# Patient Record
Sex: Male | Born: 1995 | Race: White | Hispanic: No | Marital: Single | Smoking: Never smoker
Health system: Southern US, Community
[De-identification: ages and names within clinical notes are randomized; demographics above are authoritative.]

---

## 2020-02-10 ENCOUNTER — Other Ambulatory Visit: Payer: Self-pay

## 2020-02-10 ENCOUNTER — Inpatient Hospital Stay (HOSPITAL_BASED_OUTPATIENT_CLINIC_OR_DEPARTMENT_OTHER)
Admission: EM | Admit: 2020-02-10 | Discharge: 2020-02-14 | DRG: 177 | Disposition: A | Discharge status: Home / Self Care | Payer: BC Managed Care – PPO | Attending: Internal Medicine | Admitting: Internal Medicine

## 2020-02-10 ENCOUNTER — Emergency Department (HOSPITAL_BASED_OUTPATIENT_CLINIC_OR_DEPARTMENT_OTHER): Payer: BC Managed Care – PPO

## 2020-02-10 ENCOUNTER — Encounter (HOSPITAL_BASED_OUTPATIENT_CLINIC_OR_DEPARTMENT_OTHER): Payer: Self-pay

## 2020-02-10 DIAGNOSIS — J9600 Acute respiratory failure, unspecified whether with hypoxia or hypercapnia: Secondary | ICD-10-CM | POA: Diagnosis present

## 2020-02-10 DIAGNOSIS — J1282 Pneumonia due to coronavirus disease 2019: Secondary | ICD-10-CM | POA: Diagnosis present

## 2020-02-10 DIAGNOSIS — R197 Diarrhea, unspecified: Secondary | ICD-10-CM | POA: Diagnosis present

## 2020-02-10 DIAGNOSIS — J9601 Acute respiratory failure with hypoxia: Secondary | ICD-10-CM | POA: Diagnosis present

## 2020-02-10 DIAGNOSIS — U071 COVID-19: Secondary | ICD-10-CM | POA: Diagnosis not present

## 2020-02-10 DIAGNOSIS — R112 Nausea with vomiting, unspecified: Secondary | ICD-10-CM | POA: Diagnosis present

## 2020-02-10 DIAGNOSIS — E860 Dehydration: Secondary | ICD-10-CM | POA: Diagnosis present

## 2020-02-10 LAB — LACTIC ACID, PLASMA: Lactic Acid, Venous: 1.2 mmol/L (ref 0.5–1.9)

## 2020-02-10 LAB — COMPREHENSIVE METABOLIC PANEL
ALT: 44 U/L (ref 0–44)
AST: 55 U/L — ABNORMAL HIGH (ref 15–41)
Albumin: 4.1 g/dL (ref 3.5–5.0)
Alkaline Phosphatase: 52 U/L (ref 38–126)
Anion gap: 12 (ref 5–15)
BUN: 16 mg/dL (ref 6–20)
CO2: 25 mmol/L (ref 22–32)
Calcium: 9 mg/dL (ref 8.9–10.3)
Chloride: 101 mmol/L (ref 98–111)
Creatinine, Ser: 1.51 mg/dL — ABNORMAL HIGH (ref 0.61–1.24)
GFR calc Af Amer: >60 mL/min (ref >60)
GFR calc non Af Amer: >60 mL/min (ref >60)
Glucose, Bld: 123 mg/dL — ABNORMAL HIGH (ref 70–99)
Potassium: 4 mmol/L (ref 3.5–5.1)
Sodium: 138 mmol/L (ref 135–145)
Total Bilirubin: 0.8 mg/dL (ref 0.3–1.2)
Total Protein: 7.6 g/dL (ref 6.5–8.1)

## 2020-02-10 LAB — LACTATE DEHYDROGENASE: LDH: 241 U/L — ABNORMAL HIGH (ref 98–192)

## 2020-02-10 LAB — CBC WITH DIFFERENTIAL/PLATELET
Abs Immature Granulocytes: 0.02 10*3/uL (ref 0.00–0.07)
Basophils Absolute: 0 10*3/uL (ref 0.0–0.1)
Basophils Relative: 0 %
Eosinophils Absolute: 0 10*3/uL (ref 0.0–0.5)
Eosinophils Relative: 0 %
HCT: 42.5 % (ref 39.0–52.0)
Hemoglobin: 13 g/dL (ref 13.0–17.0)
Immature Granulocytes: 0 %
Lymphocytes Relative: 15 %
Lymphs Abs: 0.7 10*3/uL (ref 0.7–4.0)
MCH: 19.4 pg — ABNORMAL LOW (ref 26.0–34.0)
MCHC: 30.6 g/dL (ref 30.0–36.0)
MCV: 63.4 fL — ABNORMAL LOW (ref 80.0–100.0)
Monocytes Absolute: 0.3 10*3/uL (ref 0.1–1.0)
Monocytes Relative: 6 %
Neutro Abs: 3.6 10*3/uL (ref 1.7–7.7)
Neutrophils Relative %: 79 %
Platelets: 186 10*3/uL (ref 150–400)
RBC: 6.7 MIL/uL — ABNORMAL HIGH (ref 4.22–5.81)
RDW: 16.4 % — ABNORMAL HIGH (ref 11.5–15.5)
WBC: 4.6 10*3/uL (ref 4.0–10.5)
nRBC: 0 % (ref 0.0–0.2)

## 2020-02-10 LAB — FIBRINOGEN: Fibrinogen: 479 mg/dL — ABNORMAL HIGH (ref 210–475)

## 2020-02-10 LAB — FERRITIN: Ferritin: 475 ng/mL — ABNORMAL HIGH (ref 24–336)

## 2020-02-10 LAB — SARS CORONAVIRUS 2 BY RT PCR (HOSPITAL ORDER, PERFORMED IN ~~LOC~~ HOSPITAL LAB): SARS Coronavirus 2: POSITIVE — AB

## 2020-02-10 LAB — C-REACTIVE PROTEIN: CRP: 3 mg/dL — ABNORMAL HIGH (ref <1.0)

## 2020-02-10 LAB — PROCALCITONIN: Procalcitonin: <0.1 ng/mL

## 2020-02-10 LAB — TRIGLYCERIDES: Triglycerides: 113 mg/dL (ref <150)

## 2020-02-10 LAB — TROPONIN I (HIGH SENSITIVITY): Troponin I (High Sensitivity): 9 ng/L (ref <18)

## 2020-02-10 LAB — D-DIMER, QUANTITATIVE: D-Dimer, Quant: 0.51 ug/mL-FEU — ABNORMAL HIGH (ref 0.00–0.50)

## 2020-02-10 MED ORDER — ACETAMINOPHEN 500 MG PO TABS
ORAL_TABLET | ORAL | Status: AC
  Filled 2020-02-10: qty 2

## 2020-02-10 MED ORDER — SODIUM CHLORIDE 0.9 % IV SOLN
100.0000 mg | Freq: Once | INTRAVENOUS | Status: AC
  Administered 2020-02-10: 100 mg via INTRAVENOUS
  Filled 2020-02-10: qty 20

## 2020-02-10 MED ORDER — SODIUM CHLORIDE 0.9 % IV SOLN
100.0000 mg | Freq: Every day | INTRAVENOUS | Status: AC
  Administered 2020-02-11 – 2020-02-14 (×4): 100 mg via INTRAVENOUS
  Filled 2020-02-10 (×4): qty 20

## 2020-02-10 MED ORDER — ONDANSETRON HCL 4 MG/2ML IJ SOLN
4.0000 mg | Freq: Once | INTRAMUSCULAR | Status: AC
  Administered 2020-02-10: 4 mg via INTRAVENOUS
  Filled 2020-02-10: qty 2

## 2020-02-10 MED ORDER — DEXAMETHASONE SODIUM PHOSPHATE 10 MG/ML IJ SOLN
6.0000 mg | Freq: Once | INTRAMUSCULAR | Status: AC
  Administered 2020-02-10: 6 mg via INTRAVENOUS
  Filled 2020-02-10: qty 1

## 2020-02-10 MED ORDER — LACTATED RINGERS IV BOLUS
500.0000 mL | Freq: Once | INTRAVENOUS | Status: AC
  Administered 2020-02-10: 500 mL via INTRAVENOUS

## 2020-02-10 MED ORDER — ACETAMINOPHEN 500 MG PO TABS
1000.0000 mg | ORAL_TABLET | Freq: Once | ORAL | Status: AC
  Administered 2020-02-10: 1000 mg via ORAL

## 2020-02-10 NOTE — ED Provider Notes (Signed)
[facility] EMERGENCY DEPARTMENT Provider Note   CSN: [id number] Arrival date & time: 02/10/20  1500     History Chief Complaint  Patient presents with  . Covid Positive    Jeremy Fitzpatrick is a 24 y.o. Fitzpatrick.  HPI      Jeremy Fitzpatrick presents with concern for severe fatigue, body aches, nausea, vomiting, fever after testing positive for COVID 19.  Arrives to the ED hypoxic to 87% on room air.  Reports symptoms started one week ago, fevers at home, diaphoresis today.  Has had fevers and severe body aches for the last week.  Productive cough and sore throat.  Was telling himself he did not feel short of breath but admits that he thinks he has been for last few days. Vomiting approx twice per day, diarrhea 4-5 times daily.   Has not had covid vaccine. Gets palpitations with steroids.  No known medical history-exercise induced asthma, not active    History reviewed. No pertinent past medical history.  There are no problems to display for this patient.   History reviewed. No pertinent surgical history.     No family history on file.  Social History   Tobacco Use  . Smoking status: Never Smoker  . Smokeless tobacco: Never Used  Substance Use Topics  . Alcohol use: Not Currently  . Drug use: Never    Home Medications Prior to Admission medications   Not on File    Allergies    Patient has no known allergies.  Review of Systems   Review of Systems  Constitutional: Positive for activity change, appetite change, chills, fatigue and fever.  HENT: Positive for congestion, rhinorrhea and sore throat.   Eyes: Negative for visual disturbance.  Respiratory: Positive for cough and shortness of breath.   Cardiovascular: Positive for chest pain.  Gastrointestinal: Positive for diarrhea, nausea and vomiting. Negative for abdominal pain.  Genitourinary: Negative for difficulty urinating and dysuria.  Musculoskeletal: Positive for arthralgias and myalgias. Negative for  back pain and neck stiffness.  Skin: Negative for rash and wound.  Neurological: Negative for syncope.    Physical Exam Updated Vital Signs BP (!) 92/57 (BP Location: Right Arm)   Pulse (!) 102   Temp 100.3 F (37.9 C) (Oral)   Resp 20   Ht 5\' 7"  (1.702 m)   Wt 99.8 kg   SpO2 (!) 87%   BMI 34.46 kg/m   Physical Exam Vitals and nursing note reviewed.  Constitutional:      General: He is not in acute distress.    Appearance: He is well-developed. He is diaphoretic.  HENT:     Head: Normocephalic and atraumatic.  Eyes:     Conjunctiva/sclera: Conjunctivae normal.  Cardiovascular:     Rate and Rhythm: Normal rate and regular rhythm.  Pulmonary:     Effort: Pulmonary effort is normal. No respiratory distress.  Abdominal:     General: There is no distension.     Palpations: Abdomen is soft.     Tenderness: There is no abdominal tenderness. There is no guarding.  Musculoskeletal:     Cervical back: Normal range of motion.  Skin:    General: Skin is warm.  Neurological:     Mental Status: He is alert and oriented to person, place, and time.     ED Results / Procedures / Treatments   Labs (all labs ordered are listed, but only abnormal results are displayed) Labs Reviewed  CULTURE, BLOOD (ROUTINE X 2)  CULTURE, BLOOD (  ROUTINE X 2)  SARS CORONAVIRUS 2 BY RT PCR (HOSPITAL ORDER, PERFORMED IN [facility])  CBC WITH DIFFERENTIAL/PLATELET  COMPREHENSIVE METABOLIC PANEL  LACTIC ACID, PLASMA  LACTIC ACID, PLASMA  D-DIMER, QUANTITATIVE (NOT AT [facility])  PROCALCITONIN  LACTATE DEHYDROGENASE  FERRITIN  TRIGLYCERIDES  FIBRINOGEN  C-REACTIVE PROTEIN  TROPONIN I (HIGH SENSITIVITY)    EKG EKG Interpretation  Date/Time:  Friday February 10 2020 15:32:00 EDT Ventricular Rate:  97 PR Interval:    QRS Duration: 82 QT Interval:  330 QTC Calculation: 420 R Axis:   83 Text Interpretation: Sinus rhythm Baseline wander in lead(s) II III aVL aVF V5 V6 No previous  ECGs available Confirmed by Alvira Monday ([id number]) on 02/10/2020 3:43:49 PM   Radiology No results found.  Procedures Procedures (including critical care time)  Medications Ordered in ED Medications  acetaminophen (TYLENOL) 500 MG tablet (has no administration in time range)  acetaminophen (TYLENOL) tablet 1,000 mg (1,000 mg Oral Given 02/10/20 1552)  dexamethasone (DECADRON) injection 6 mg (6 mg Intravenous Given 02/10/20 1608)  lactated ringers bolus 500 mL (500 mLs Intravenous New Bag/Given 02/10/20 1607)  ondansetron (ZOFRAN) injection 4 mg (4 mg Intravenous Given 02/10/20 1608)    ED Course  I have reviewed the triage vital signs and the nursing notes.  Pertinent labs & imaging results that were available during my care of the patient were reviewed by me and considered in my medical decision making (see chart for details).    MDM Rules/Calculators/A&P                          Jeremy Fitzpatrick with history of exercise induced asthma presents with concern for severe fatigue, body aches, nausea, vomiting, fever after testing positive for COVID 19.  Arrives to the ED with saturation 87% on room air, improved with 2L O2.  Given tylenol, lactated ringers 500cc for rehydration, decadron and remdesivir.  Will admit for continued care of hypoxia in setting of COVID 19.      Final Clinical Impression(s) / ED Diagnoses Final diagnoses:  COVID-19  Acute respiratory failure with hypoxia [facility])    Rx / DC Orders ED Discharge Orders    None       Alvira Monday, MD 02/11/20 [phone]

## 2020-02-10 NOTE — ED Triage Notes (Signed)
Pt arrives with report of positive Covid test at [facility] on last Friday. Pt reports starting steroid and antibiotic yesterday. Pt has had Nyquil today. Pt has coughed up red sputum. Denies eating anything red in the past 24 hours.

## 2020-02-10 NOTE — ED Notes (Signed)
Patient in prone position at this time. SpO2 -97% in 2lpm [place].

## 2020-02-10 NOTE — ED Notes (Signed)
Instructed patient on the proper use of an Accapello/Flutter Valve. Resting oxygen saturations were 92% pre-flutter valve and during instructing oxygen saturations increased to 99%. Instructed patient to use Q1 hr X 10 WA while not getting SOB. BBS fine crackles. Patient on monitor and will continue to assess as necessary. Patient tolerated well.

## 2020-02-11 DIAGNOSIS — J9601 Acute respiratory failure with hypoxia: Secondary | ICD-10-CM | POA: Diagnosis present

## 2020-02-11 DIAGNOSIS — J1282 Pneumonia due to coronavirus disease 2019: Secondary | ICD-10-CM | POA: Diagnosis present

## 2020-02-11 DIAGNOSIS — R112 Nausea with vomiting, unspecified: Secondary | ICD-10-CM | POA: Diagnosis present

## 2020-02-11 DIAGNOSIS — U071 COVID-19: Principal | ICD-10-CM

## 2020-02-11 DIAGNOSIS — J9600 Acute respiratory failure, unspecified whether with hypoxia or hypercapnia: Secondary | ICD-10-CM | POA: Diagnosis not present

## 2020-02-11 DIAGNOSIS — E860 Dehydration: Secondary | ICD-10-CM | POA: Diagnosis present

## 2020-02-11 DIAGNOSIS — R197 Diarrhea, unspecified: Secondary | ICD-10-CM | POA: Diagnosis present

## 2020-02-11 LAB — TROPONIN I (HIGH SENSITIVITY): Troponin I (High Sensitivity): 9 ng/L (ref <18)

## 2020-02-11 LAB — CBC WITH DIFFERENTIAL/PLATELET
Abs Immature Granulocytes: 0.01 10*3/uL (ref 0.00–0.07)
Basophils Absolute: 0 10*3/uL (ref 0.0–0.1)
Basophils Relative: 0 %
Eosinophils Absolute: 0 10*3/uL (ref 0.0–0.5)
Eosinophils Relative: 0 %
HCT: 42.4 % (ref 39.0–52.0)
Hemoglobin: 13.1 g/dL (ref 13.0–17.0)
Immature Granulocytes: 0 %
Lymphocytes Relative: 12 %
Lymphs Abs: 0.5 10*3/uL — ABNORMAL LOW (ref 0.7–4.0)
MCH: 19.5 pg — ABNORMAL LOW (ref 26.0–34.0)
MCHC: 30.9 g/dL (ref 30.0–36.0)
MCV: 63.1 fL — ABNORMAL LOW (ref 80.0–100.0)
Monocytes Absolute: 0.3 10*3/uL (ref 0.1–1.0)
Monocytes Relative: 8 %
Neutro Abs: 3.4 10*3/uL (ref 1.7–7.7)
Neutrophils Relative %: 80 %
Platelets: 278 10*3/uL (ref 150–400)
RBC: 6.72 MIL/uL — ABNORMAL HIGH (ref 4.22–5.81)
RDW: 16.9 % — ABNORMAL HIGH (ref 11.5–15.5)
WBC: 4.3 10*3/uL (ref 4.0–10.5)
nRBC: 0 % (ref 0.0–0.2)

## 2020-02-11 LAB — COMPREHENSIVE METABOLIC PANEL
ALT: 72 U/L — ABNORMAL HIGH (ref 0–44)
AST: 82 U/L — ABNORMAL HIGH (ref 15–41)
Albumin: 3.7 g/dL (ref 3.5–5.0)
Alkaline Phosphatase: 45 U/L (ref 38–126)
Anion gap: 11 (ref 5–15)
BUN: 17 mg/dL (ref 6–20)
CO2: 26 mmol/L (ref 22–32)
Calcium: 9.1 mg/dL (ref 8.9–10.3)
Chloride: 103 mmol/L (ref 98–111)
Creatinine, Ser: 1.07 mg/dL (ref 0.61–1.24)
GFR calc Af Amer: >60 mL/min (ref >60)
GFR calc non Af Amer: >60 mL/min (ref >60)
Glucose, Bld: 154 mg/dL — ABNORMAL HIGH (ref 70–99)
Potassium: 4 mmol/L (ref 3.5–5.1)
Sodium: 140 mmol/L (ref 135–145)
Total Bilirubin: 0.8 mg/dL (ref 0.3–1.2)
Total Protein: 6.8 g/dL (ref 6.5–8.1)

## 2020-02-11 LAB — HIV ANTIBODY (ROUTINE TESTING W REFLEX): HIV Screen 4th Generation wRfx: NONREACTIVE

## 2020-02-11 LAB — D-DIMER, QUANTITATIVE: D-Dimer, Quant: 0.45 ug/mL-FEU (ref 0.00–0.50)

## 2020-02-11 LAB — C-REACTIVE PROTEIN: CRP: 1.2 mg/dL — ABNORMAL HIGH (ref <1.0)

## 2020-02-11 LAB — PROCALCITONIN: Procalcitonin: <0.1 ng/mL

## 2020-02-11 LAB — FERRITIN: Ferritin: 535 ng/mL — ABNORMAL HIGH (ref 24–336)

## 2020-02-11 LAB — LACTATE DEHYDROGENASE: LDH: 269 U/L — ABNORMAL HIGH (ref 98–192)

## 2020-02-11 LAB — ABO/RH: ABO/RH(D): AB POS

## 2020-02-11 MED ORDER — ZINC SULFATE 220 (50 ZN) MG PO CAPS
220.0000 mg | ORAL_CAPSULE | Freq: Every day | ORAL | Status: DC
  Administered 2020-02-11 – 2020-02-14 (×4): 220 mg via ORAL
  Filled 2020-02-11 (×4): qty 1

## 2020-02-11 MED ORDER — SODIUM CHLORIDE 0.9 % IV SOLN: 100.0000 mg | Freq: Every day | INTRAVENOUS | Status: DC

## 2020-02-11 MED ORDER — ENOXAPARIN SODIUM 40 MG/0.4ML ~~LOC~~ SOLN
40.0000 mg | SUBCUTANEOUS | Status: DC
  Administered 2020-02-11 – 2020-02-13 (×3): 40 mg via SUBCUTANEOUS
  Filled 2020-02-11 (×3): qty 0.4

## 2020-02-11 MED ORDER — IPRATROPIUM-ALBUTEROL 20-100 MCG/ACT IN AERS
1.0000 | INHALATION_SPRAY | Freq: Four times a day (QID) | RESPIRATORY_TRACT | Status: DC
  Administered 2020-02-11 – 2020-02-14 (×8): 1 via RESPIRATORY_TRACT
  Filled 2020-02-11 (×2): qty 4

## 2020-02-11 MED ORDER — DEXAMETHASONE SODIUM PHOSPHATE 10 MG/ML IJ SOLN
6.0000 mg | INTRAMUSCULAR | Status: DC
  Administered 2020-02-11 – 2020-02-12 (×3): 6 mg via INTRAVENOUS
  Filled 2020-02-11: qty 1

## 2020-02-11 MED ORDER — ASCORBIC ACID 500 MG PO TABS
500.0000 mg | ORAL_TABLET | Freq: Every day | ORAL | Status: DC
  Administered 2020-02-11 – 2020-02-14 (×4): 500 mg via ORAL
  Filled 2020-02-11 (×4): qty 1

## 2020-02-11 MED ORDER — DEXAMETHASONE SODIUM PHOSPHATE 10 MG/ML IJ SOLN
6.0000 mg | INTRAMUSCULAR | Status: DC
  Administered 2020-02-11 – 2020-02-13 (×2): 6 mg via INTRAVENOUS
  Filled 2020-02-11 (×3): qty 1

## 2020-02-11 MED ORDER — ONDANSETRON HCL 4 MG PO TABS: 4.0000 mg | ORAL_TABLET | Freq: Four times a day (QID) | ORAL | Status: DC | PRN

## 2020-02-11 MED ORDER — DEXAMETHASONE SODIUM PHOSPHATE 10 MG/ML IJ SOLN
6.0000 mg | Freq: Once | INTRAMUSCULAR | Status: DC
  Filled 2020-02-11: qty 1

## 2020-02-11 MED ORDER — SODIUM CHLORIDE 0.9 % IV SOLN: 200.0000 mg | Freq: Once | INTRAVENOUS | Status: DC

## 2020-02-11 MED ORDER — SODIUM CHLORIDE 0.9 % IV SOLN
500.0000 mg | INTRAVENOUS | Status: DC
  Administered 2020-02-11: 500 mg via INTRAVENOUS
  Filled 2020-02-11: qty 500

## 2020-02-11 MED ORDER — GUAIFENESIN-DM 100-10 MG/5ML PO SYRP: 10.0000 mL | ORAL_SOLUTION | ORAL | Status: DC | PRN

## 2020-02-11 MED ORDER — TOCILIZUMAB 400 MG/20ML IV SOLN: 8.0000 mg/kg | Freq: Once | INTRAVENOUS | Status: DC

## 2020-02-11 MED ORDER — ONDANSETRON HCL 4 MG/2ML IJ SOLN: 4.0000 mg | Freq: Four times a day (QID) | INTRAMUSCULAR | Status: DC | PRN

## 2020-02-11 MED ORDER — HYDROCOD POLST-CPM POLST ER 10-8 MG/5ML PO SUER: 5.0000 mL | Freq: Two times a day (BID) | ORAL | Status: DC | PRN

## 2020-02-11 MED ORDER — ENOXAPARIN SODIUM 40 MG/0.4ML ~~LOC~~ SOLN: 40.0000 mg | SUBCUTANEOUS | Status: DC

## 2020-02-11 MED ORDER — ACETAMINOPHEN 325 MG PO TABS: 650.0000 mg | ORAL_TABLET | Freq: Four times a day (QID) | ORAL | Status: DC | PRN

## 2020-02-11 NOTE — ED Notes (Signed)
PT not going to admit bed. Pt informed.

## 2020-02-11 NOTE — ED Notes (Signed)
Patient was on 2L [place] with increasing oxygen de-saturation to 88-90%. Patient increased to [facility] with oxygen saturations of 94% RR 21. Patient resting comfortably and using flutter valve WA. Patient on monitor and will continue to assess and make changes as necessary.

## 2020-02-11 NOTE — ED Notes (Signed)
Pt requested cheerios and water.

## 2020-02-11 NOTE — ED Notes (Signed)
Pt arrives via [facility] from [facility] for covid treatment. Patient arrives on 2L o2, ivx2, reports some back pain from sleeping uncomfortably. No other complaints at this time.

## 2020-02-11 NOTE — H&P (Signed)
History and Physical   Jeremy Fitzpatrick OEV:[id number] DOB: 02/11/1996 DOA: 02/10/2020  Referring MD/NP/PA: Dr. Dalene Seltzer  PCP: Blane Ohara, MD   Outpatient Specialists: None  Patient coming from: [facility] via [facility]  Chief Complaint: Fever and chills  HPI: Jeremy Fitzpatrick is a 24 y.o. male with medical history significant of no significant past medical history who was seen yesterday at [facility] with fever chills generalized debility and nausea vomiting.  Patient diagnosed with COVID-19 pneumonia with new oxygen requirement.  He was accepted for admission and transferred to [facility] ER for hold-up.  He is still symptomatic having nausea.  Also diarrhea now.  He still requiring about 2 L of oxygen.  Patient otherwise has some cough which is nonproductive..  ED Course: Temperature 98.2 blood pressure 106/50 pulse 106 respiratory rate of 26 oxygen sat 89% on room air.  White count 6.0 hemoglobin 12.2 platelets 245 chemistry mostly within normal.  Chest x-ray showed bilateral infiltrates significant for COVID-19 pneumonia.  Elevated inflammatory markers as well.  Patient being admitted for further evaluation and treatment.  Review of Systems: As per HPI otherwise 10 point review of systems negative.    History reviewed. No pertinent past medical history.  History reviewed. No pertinent surgical history.   reports that he has never smoked. He has never used smokeless tobacco. He reports previous alcohol use. He reports that he does not use drugs.  No Known Allergies  No family history on file.   Prior to Admission medications   Medication Sig Start Date End Date Taking? Authorizing Provider  azithromycin (ZITHROMAX) 250 MG tablet Take 250 mg by mouth See admin instructions. Take 2 tablets day 1, then 1 tablet on days 2-5. 02/09/20  Yes [provider]  celecoxib (CELEBREX) 200 MG capsule Take 200 mg by mouth daily. 02/09/20  Yes [provider]    [employer] VITAMIN C 1000 MG tablet Take 2,000 mg by mouth daily. 02/03/20  Yes [provider]  D3-50 1.25 MG (50000 UT) capsule Take 50,000 Units by mouth daily. 02/03/20  Yes [provider]  predniSONE (DELTASONE) 10 MG tablet Take 10-60 mg by mouth See admin instructions. 6 day taper 6-5-4-3-2-1 02/09/20  Yes [provider]  zinc gluconate 50 MG tablet Take 50 mg by mouth daily. 02/03/20  Yes [provider]    Physical Exam: Vitals:   02/11/20 0750 02/11/20 1334 02/11/20 1658 02/11/20 1740  BP:  136/68  137/69  Pulse: 87 83 90 83  Resp: (!) 21 16 (!) 23 (!) 24  Temp:    98.2 F (36.8 C)  TempSrc:    Oral  SpO2: (!) 89% 94% 94% 95%  Weight:      Height:          Constitutional: Acutely ill looking, anxious and weak Vitals:   02/11/20 0750 02/11/20 1334 02/11/20 1658 02/11/20 1740  BP:  136/68  137/69  Pulse: 87 83 90 83  Resp: (!) 21 16 (!) 23 (!) 24  Temp:    98.2 F (36.8 C)  TempSrc:    Oral  SpO2: (!) 89% 94% 94% 95%  Weight:      Height:       Eyes: PERRL, lids and conjunctivae normal ENMT: Mucous membranes are moist. Posterior pharynx clear of any exudate or lesions.Normal dentition.  Neck: normal, supple, no masses, no thyromegaly Respiratory: Coarse breath sound, rhonchi and rails normal respiratory effort. No accessory muscle use.  Cardiovascular: Sinus  tachycardia, no murmurs / rubs / gallops. No extremity edema. 2+ pedal pulses. No carotid bruits.  Abdomen: no tenderness, no masses palpated. No hepatosplenomegaly. Bowel sounds positive.  Musculoskeletal: no clubbing / cyanosis. No joint deformity upper and lower extremities. Good ROM, no contractures. Normal muscle tone.  Skin: no rashes, lesions, ulcers. No induration Neurologic: CN 2-12 grossly intact. Sensation intact, DTR normal. Strength 5/5 in all 4.  Psychiatric: Normal judgment and insight. Alert and oriented x 3. Normal mood.     Labs on Admission: I have personally  reviewed following labs and imaging studies  CBC: Recent Labs  Lab 02/10/20 1539  WBC 4.6  NEUTROABS 3.6  HGB 13.0  HCT 42.5  MCV 63.4*  PLT 186   Basic Metabolic Panel: Recent Labs  Lab 02/10/20 1539  NA 138  K 4.0  CL 101  CO2 25  GLUCOSE 123*  BUN 16  CREATININE 1.51*  CALCIUM 9.0   GFR: Estimated Creatinine Clearance: 85.7 mL/min (A) (by C-G formula based on SCr of 1.51 mg/dL (H)). Liver Function Tests: Recent Labs  Lab 02/10/20 1539  AST 55*  ALT 44  ALKPHOS 52  BILITOT 0.8  PROT 7.6  ALBUMIN 4.1   No results for input(s): LIPASE, AMYLASE in the last 168 hours. No results for input(s): AMMONIA in the last 168 hours. Coagulation Profile: No results for input(s): INR, PROTIME in the last 168 hours. Cardiac Enzymes: No results for input(s): CKTOTAL, CKMB, CKMBINDEX, TROPONINI in the last 168 hours. BNP (last 3 results) No results for input(s): PROBNP in the last 8760 hours. HbA1C: No results for input(s): HGBA1C in the last 72 hours. CBG: No results for input(s): GLUCAP in the last 168 hours. Lipid Profile: Recent Labs    02/10/20 1538  TRIG 113   Thyroid Function Tests: No results for input(s): TSH, T4TOTAL, FREET4, T3FREE, THYROIDAB in the last 72 hours. Anemia Panel: Recent Labs    02/10/20 1538  FERRITIN 475*   Urine analysis: No results found for: COLORURINE, APPEARANCEUR, LABSPEC, PHURINE, GLUCOSEU, HGBUR, BILIRUBINUR, KETONESUR, PROTEINUR, UROBILINOGEN, NITRITE, LEUKOCYTESUR Sepsis Labs: @LABRCNTIP (procalcitonin:4,lacticidven:4) ) Recent Results (from the past 240 hour(s))  Blood culture (routine x 2)     Status: None (Preliminary result)   Collection Time: 02/10/20  3:30 PM   Specimen: BLOOD  Result Value Ref Range Status   Specimen Description   Final    BLOOD LEFT ANTECUBITAL Performed at [facility], [address] [place], [place]    Special Requests   Final    BOTTLES DRAWN AEROBIC AND ANAEROBIC Blood  Culture adequate volume Performed at [facility], [address] [place], [place]    Culture   Final    NO GROWTH < 24 HOURS Performed at [facility], [address] [place], [place]    Report Status PENDING  Incomplete  Blood culture (routine x 2)     Status: None (Preliminary result)   Collection Time: 02/10/20  3:42 PM   Specimen: BLOOD LEFT HAND  Result Value Ref Range Status   Specimen Description   Final    BLOOD LEFT HAND Performed at [facility], [address] [place], [place]    Special Requests   Final    BOTTLES DRAWN AEROBIC AND ANAEROBIC Blood Culture adequate volume Performed at [facility], [address], [place], [place]    Culture   Final  NO GROWTH < 24 HOURS Performed at [facility], [address] [place], [place] [address]    Report Status PENDING  Incomplete  SARS Coronavirus 2 by RT PCR (hospital order, performed in [facility] hospital lab) Nasopharyngeal Peripheral     Status: Abnormal   Collection Time: 02/10/20  3:42 PM   Specimen: Peripheral; Nasopharyngeal  Result Value Ref Range Status   SARS Coronavirus 2 POSITIVE (A) NEGATIVE Final    Comment: RESULT CALLED TO, READ BACK BY AND VERIFIED WITH: JAMIE BAILEY RN AT 1700 ON 02/10/2020 BY [email or web address] (NOTE) SARS-CoV-2 target nucleic acids are DETECTED  SARS-CoV-2 RNA is generally detectable in upper respiratory specimens  during the acute phase of infection.  Positive results are indicative  of the presence of the identified virus, but do not rule out bacterial infection or co-infection with other pathogens not detected by the test.  Clinical correlation with patient history and  other diagnostic information is necessary to determine patient infection status.  The expected result is negative.  Fact Sheet for Patients:   BoilerBrush.com.cyhttps://www.fda.gov/media/136312/download   Fact Sheet for Healthcare Providers:     https://pope.com/https://www.fda.gov/media/136313/download    This test is not yet approved or cleared by the [place] FDA and  has been authorized for detection and/or diagnosis of SARS-CoV-2 by FDA under an Emergency Use Authorization (EUA).  This EUA will remain in effect (me aning this test can be used) for the duration of  the COVID-19 declaration under Section 564(b)(1) of the Act, 21 U.S.C. section 360-bbb-3(b)(1), unless the authorization is terminated or revoked sooner.  Performed at [facility], [address] [place], [place] [address]      Radiological Exams on Admission: DG [facility]  Result Date: 02/10/2020 CLINICAL DATA:  Dyspnea, COVID pneumonia EXAM: PORTABLE CHEST 1 VIEW COMPARISON:  None. FINDINGS: Lung volumes are small, but are symmetric. Patchy asymmetric pulmonary infiltrate is seen within the left mid lung zone and lung bases bilaterally in keeping with acute bronchopneumonia in the appropriate clinical setting. No pneumothorax or pleural effusion. Cardiac size within normal limits. Pulmonary vascularity is normal. No acute bone abnormality. IMPRESSION: Patchy asymmetric bilateral pulmonary infiltrates in keeping with acute bronchopneumonia in the appropriate clinical setting. Electronically Signed   By: Helyn NumbersAshesh  Parikh MD   On: 02/10/2020 17:09    EKG: Independently reviewed.  Sinus tachycardia  Assessment/Plan Principal Problem:   Acute respiratory failure due to COVID-19 [facility](HCC)     #1 acute respiratory failure secondary to COVID-19 pneumonia: Patient admitted and started on dexamethasone, azithromycin, remdesivir, oxygen as well as breathing treatments.  Also supportive care with Tylenol.  #2 palpitations: Continue monitoring most likely due to dehydration and acute infection.   DVT prophylaxis: Lovenox Code Status: Full code Family Communication: No family at bedside Disposition Plan: Home Consults called: None Admission status:  Inpatient  Severity of Illness: The appropriate patient status for this patient is INPATIENT. Inpatient status is judged to be reasonable and necessary in order to provide the required intensity of service to ensure the patient's safety. The patient's presenting symptoms, physical exam findings, and initial radiographic and laboratory data in the context of their chronic comorbidities is felt to place them at high risk for further clinical deterioration. Furthermore, it is not anticipated that the patient will be medically stable for discharge from the hospital within 2 midnights of admission. The following factors support the patient status of inpatient.   " The patient's presenting symptoms include  fever chills and shortness of breath. " The worrisome physical exam findings include coarse breath sounds and anxious. " The initial radiographic and laboratory data are worrisome because of COVID-19 infection and pneumonia on x-ray. " The chronic co-morbidities include none.   * I certify that at the point of admission it is my clinical judgment that the patient will require inpatient hospital care spanning beyond 2 midnights from the point of admission due to high intensity of service, high risk for further deterioration and high frequency of surveillance required.Lonia Blood MD [facility] Pager [phone]  If 7PM-7AM, please contact night-coverage [email or web address] Password [facility]  02/11/2020, 6:48 PM

## 2020-02-11 NOTE — ED Notes (Signed)
Attempted to contact hospitalist for admission orders. Was told they would not put in orders until they were transferred.

## 2020-02-11 NOTE — ED Notes (Signed)
Pt transferred to [facility] via [facility] 

## 2020-02-11 NOTE — ED Notes (Signed)
Patient resting comfortably on 2L [place] with oxygen saturations of 93% RR 21. Patient on monitor and will continue assess and make necessary changes.

## 2020-02-12 LAB — CBC WITH DIFFERENTIAL/PLATELET
Abs Immature Granulocytes: 0.02 10*3/uL (ref 0.00–0.07)
Basophils Absolute: 0 10*3/uL (ref 0.0–0.1)
Basophils Relative: 0 %
Eosinophils Absolute: 0 10*3/uL (ref 0.0–0.5)
Eosinophils Relative: 0 %
HCT: 40 % (ref 39.0–52.0)
Hemoglobin: 12.2 g/dL — ABNORMAL LOW (ref 13.0–17.0)
Immature Granulocytes: 0 %
Lymphocytes Relative: 13 %
Lymphs Abs: 0.8 10*3/uL (ref 0.7–4.0)
MCH: 19.3 pg — ABNORMAL LOW (ref 26.0–34.0)
MCHC: 30.5 g/dL (ref 30.0–36.0)
MCV: 63.4 fL — ABNORMAL LOW (ref 80.0–100.0)
Monocytes Absolute: 0.4 10*3/uL (ref 0.1–1.0)
Monocytes Relative: 7 %
Neutro Abs: 4.8 10*3/uL (ref 1.7–7.7)
Neutrophils Relative %: 80 %
Platelets: 245 10*3/uL (ref 150–400)
RBC: 6.31 MIL/uL — ABNORMAL HIGH (ref 4.22–5.81)
RDW: 15.7 % — ABNORMAL HIGH (ref 11.5–15.5)
WBC: 6 10*3/uL (ref 4.0–10.5)
nRBC: 0 % (ref 0.0–0.2)

## 2020-02-12 LAB — COMPREHENSIVE METABOLIC PANEL
ALT: 63 U/L — ABNORMAL HIGH (ref 0–44)
AST: 64 U/L — ABNORMAL HIGH (ref 15–41)
Albumin: 3.3 g/dL — ABNORMAL LOW (ref 3.5–5.0)
Alkaline Phosphatase: 39 U/L (ref 38–126)
Anion gap: 9 (ref 5–15)
BUN: 17 mg/dL (ref 6–20)
CO2: 25 mmol/L (ref 22–32)
Calcium: 8.7 mg/dL — ABNORMAL LOW (ref 8.9–10.3)
Chloride: 106 mmol/L (ref 98–111)
Creatinine, Ser: 1.09 mg/dL (ref 0.61–1.24)
GFR calc Af Amer: >60 mL/min (ref >60)
GFR calc non Af Amer: >60 mL/min (ref >60)
Glucose, Bld: 154 mg/dL — ABNORMAL HIGH (ref 70–99)
Potassium: 4.5 mmol/L (ref 3.5–5.1)
Sodium: 140 mmol/L (ref 135–145)
Total Bilirubin: 0.6 mg/dL (ref 0.3–1.2)
Total Protein: 6.1 g/dL — ABNORMAL LOW (ref 6.5–8.1)

## 2020-02-12 LAB — D-DIMER, QUANTITATIVE: D-Dimer, Quant: 0.39 ug/mL-FEU (ref 0.00–0.50)

## 2020-02-12 LAB — FERRITIN: Ferritin: 512 ng/mL — ABNORMAL HIGH (ref 24–336)

## 2020-02-12 MED ORDER — PANTOPRAZOLE SODIUM 40 MG PO TBEC
40.0000 mg | DELAYED_RELEASE_TABLET | Freq: Every day | ORAL | Status: DC
  Administered 2020-02-12 – 2020-02-14 (×3): 40 mg via ORAL
  Filled 2020-02-12 (×3): qty 1

## 2020-02-12 NOTE — ED Notes (Signed)
Dinner tray delivered.

## 2020-02-12 NOTE — Progress Notes (Signed)
PROGRESS NOTE                                                                                                                                                                                                             Patient Demographics:    Jeremy Fitzpatrick, is a 24 y.o. male, DOB - [birth year], BDZ:[id number]  Admit date - 02/10/2020   Admitting Physician Rometta Emery, MD  Outpatient Primary MD for the patient is Cox, Fritzi Mandes, MD  LOS - 1   Chief Complaint  Patient presents with  . Covid Positive       Brief Narrative    HPI: Jeremy Fitzpatrick is a 24 y.o. male with medical history significant of no significant past medical history who was seen yesterday at [facility] with fever chills generalized debility and nausea vomiting.  Patient diagnosed with COVID-19 pneumonia with new oxygen requirement.  He was accepted for admission and transferred to [facility] ER for hold-up.  He is still symptomatic having nausea.  Also diarrhea now.  He still requiring about 2 L of oxygen.  Patient otherwise has some cough which is nonproductive..  ED Course: Temperature 98.2 blood pressure 106/50 pulse 106 respiratory rate of 26 oxygen sat 89% on room air.  White count 6.0 hemoglobin 12.2 platelets 245 chemistry mostly within normal.  Chest x-ray showed bilateral infiltrates significant for COVID-19 pneumonia.  Elevated inflammatory markers as well.  Patient being admitted for further evaluation and treatment.   Subjective:    Jeremy Fitzpatrick today does report some cough, report dyspnea has improved, denies any nausea or vomiting.    Assessment  & Plan :    Principal Problem:   Acute respiratory failure due to COVID-19 [facility])   acute respiratory failure secondary to COVID-19 pneumonia: -Patient is on 2 L nasal cannula this morning, was encouraged with incentive spirometry, flutter valve, and encouraged to get out of bed to chair, showing a symmetric  bilateral pulmonary infiltrate consistent with COVID-19 pneumonia. -Continue with IV steroids -Continue with IV remdesivir -No indication for antibiotics, will discontinue azithromycin -continue to trend inflammatory markers   COVID-19 Labs  Recent Labs    02/10/20 1538 02/11/20 1920 02/12/20 0400  DDIMER 0.51* 0.45 0.39  FERRITIN 475* 535* 512*  LDH 241* 269*  --   CRP 3.0* 1.2*  --  Lab Results  Component Value Date   SARSCOV2NAA POSITIVE (A) 02/10/2020     Code Status : Full  Family Communication  : left father a voicemail  Disposition Plan  :  Status is: Inpatient  Remains inpatient appropriate because:IV treatments appropriate due to intensity of illness or inability to take PO   Dispo: The patient is from: Home              Anticipated d/c is to: Home              Anticipated d/c date is: 2 days              Patient currently is medically stable to d/c.        Consults  :  none  Procedures  : none  DVT Prophylaxis  :  [place] lovenox  Lab Results  Component Value Date   PLT 245 02/12/2020    Antibiotics  :    Anti-infectives (From admission, onward)   Start     Dose/Rate Route Frequency Ordered Stop   02/12/20 1000  remdesivir 100 mg in sodium chloride 0.9 % 100 mL IVPB  Status:  Discontinued       "Followed by" Linked Group Details   100 mg 200 mL/hr over 30 Minutes Intravenous Daily 02/11/20 1904 02/11/20 1923   02/11/20 1915  azithromycin (ZITHROMAX) 500 mg in sodium chloride 0.9 % 250 mL IVPB     Discontinue     500 mg 250 mL/hr over 60 Minutes Intravenous Every 24 hours 02/11/20 1904     02/11/20 1904  remdesivir 200 mg in sodium chloride 0.9% 250 mL IVPB  Status:  Discontinued       "Followed by" Linked Group Details   200 mg 580 mL/hr over 30 Minutes Intravenous Once 02/11/20 1904 02/11/20 1923   02/11/20 1000  remdesivir 100 mg in sodium chloride 0.9 % 100 mL IVPB     Discontinue    "Followed by" Linked Group Details   100 mg 200  mL/hr over 30 Minutes Intravenous Daily 02/10/20 1642 02/15/20 0959   02/10/20 1730  remdesivir 100 mg in sodium chloride 0.9 % 100 mL IVPB       "Followed by" Linked Group Details   100 mg 200 mL/hr over 30 Minutes Intravenous  Once 02/10/20 1642 02/10/20 1813   02/10/20 1700  remdesivir 100 mg in sodium chloride 0.9 % 100 mL IVPB       "Followed by" Linked Group Details   100 mg 200 mL/hr over 30 Minutes Intravenous  Once 02/10/20 1642 02/10/20 1731        Objective:   Vitals:   02/12/20 1453 02/12/20 1458 02/12/20 1503 02/12/20 1528  BP:    137/68  Pulse: (!) 55 87 84 86  Resp: 19 (!) 27 20 19   Temp:    98.4 F (36.9 C)  TempSrc:    Oral  SpO2: 95% 94% 93% 93%  Weight:      Height:        Wt Readings from Last 3 Encounters:  02/10/20 99.8 kg     Intake/Output Summary (Last 24 hours) at 02/12/2020 1537 Last data filed at 02/12/2020 1450 Gross per 24 hour  Intake 100 ml  Output 501 ml  Net -401 ml     Physical Exam  Awake Alert, Oriented X 3, No new F.N deficits, Normal affect Symmetrical Chest wall movement, Good air movement bilaterally, CTAB RRR,No Gallops,Rubs or new Murmurs, No Parasternal  Heave +ve B.Sounds, Abd Soft, No tenderness,  No rebound - guarding or rigidity. No Cyanosis, Clubbing or edema, No new Rash or bruise      Data Review:    CBC Recent Labs  Lab 02/10/20 1539 02/11/20 1920 02/12/20 0400  WBC 4.6 4.3 6.0  HGB 13.0 13.1 12.2*  HCT 42.5 42.4 40.0  PLT 186 278 245  MCV 63.4* 63.1* 63.4*  MCH 19.4* 19.5* 19.3*  MCHC 30.6 30.9 30.5  RDW 16.4* 16.9* 15.7*  LYMPHSABS 0.7 0.5* 0.8  MONOABS 0.3 0.3 0.4  EOSABS 0.0 0.0 0.0  BASOSABS 0.0 0.0 0.0    Chemistries  Recent Labs  Lab 02/10/20 1539 02/11/20 1920 02/12/20 0400  NA 138 140 140  K 4.0 4.0 4.5  CL 101 103 106  CO2 25 26 25   GLUCOSE 123* 154* 154*  BUN 16 17 17   CREATININE 1.51* 1.07 1.09  CALCIUM 9.0 9.1 8.7*  AST 55* 82* 64*  ALT 44 72* 63*  ALKPHOS 52 45 39    BILITOT 0.8 0.8 0.6   ------------------------------------------------------------------------------------------------------------------ Recent Labs    02/10/20 1538  TRIG 113    No results found for: HGBA1C ------------------------------------------------------------------------------------------------------------------ No results for input(s): TSH, T4TOTAL, T3FREE, THYROIDAB in the last 72 hours.  Invalid input(s): FREET3 ------------------------------------------------------------------------------------------------------------------ Recent Labs    02/11/20 1920 02/12/20 0400  FERRITIN 535* 512*    Coagulation profile No results for input(s): INR, PROTIME in the last 168 hours.  Recent Labs    02/11/20 1920 02/12/20 0400  DDIMER 0.45 0.39    Cardiac Enzymes No results for input(s): CKMB, TROPONINI, MYOGLOBIN in the last 168 hours.  Invalid input(s): CK ------------------------------------------------------------------------------------------------------------------ No results found for: BNP  Inpatient Medications  Scheduled Meds: . vitamin C  500 mg Oral Daily  . dexamethasone (DECADRON) injection  6 mg Intravenous Q24H  . dexamethasone (DECADRON) injection  6 mg Intravenous Q24H  . enoxaparin (LOVENOX) injection  40 mg Subcutaneous Q24H  . Ipratropium-Albuterol  1 puff Inhalation Q6H  . zinc sulfate  220 mg Oral Daily   Continuous Infusions: . azithromycin Stopped (02/11/20 2320)  . remdesivir 100 mg in NS 100 mL Stopped (02/12/20 1049)   PRN Meds:.acetaminophen, chlorpheniramine-HYDROcodone, guaiFENesin-dextromethorphan, ondansetron **OR** ondansetron (ZOFRAN) IV  Micro Results Recent Results (from the past 240 hour(s))  Blood culture (routine x 2)     Status: None (Preliminary result)   Collection Time: 02/10/20  3:30 PM   Specimen: BLOOD  Result Value Ref Range Status   Specimen Description   Final    BLOOD LEFT ANTECUBITAL Performed at [facility], [address] [place], [place] [address]    Special Requests   Final    BOTTLES DRAWN AEROBIC AND ANAEROBIC Blood Culture adequate volume Performed at [facility] [place], [address] [place], [place] [address]    Culture   Final    NO GROWTH 2 DAYS Performed at [facility], [address] [place], [place] [address]    Report Status PENDING  Incomplete  Blood culture (routine x 2)     Status: None (Preliminary result)   Collection Time: 02/10/20  3:42 PM   Specimen: BLOOD LEFT HAND  Result Value Ref Range Status   Specimen Description   Final    BLOOD LEFT HAND Performed at [facility] [place], [address] [place], [place] [address]    Special Requests   Final    BOTTLES DRAWN AEROBIC AND ANAEROBIC  Blood Culture adequate volume Performed at [facility], [address] [place], [place] [address]    Culture   Final    NO GROWTH 2 DAYS Performed at [facility], [address] [place], [place] [address]    Report Status PENDING  Incomplete  SARS Coronavirus 2 by RT PCR (hospital order, performed in [facility] hospital lab) Nasopharyngeal Peripheral     Status: Abnormal   Collection Time: 02/10/20  3:42 PM   Specimen: Peripheral; Nasopharyngeal  Result Value Ref Range Status   SARS Coronavirus 2 POSITIVE (A) NEGATIVE Final    Comment: RESULT CALLED TO, READ BACK BY AND VERIFIED WITH: JAMIE BAILEY RN AT 1700 ON 02/10/2020 BY I.[email or web address] (NOTE) SARS-CoV-2 target nucleic acids are DETECTED  SARS-CoV-2 RNA is generally detectable in upper respiratory specimens  during the acute phase of infection.  Positive results are indicative  of the presence of the identified virus, but do not rule out bacterial infection or co-infection with other pathogens not detected by the test.  Clinical correlation with patient history and  other diagnostic information is necessary to determine patient infection status.  The expected result is  negative.  Fact Sheet for Patients:   BoilerBrush.com.cy   Fact Sheet for Healthcare Providers:   https://pope.com/    This test is not yet approved or cleared by the [place] FDA and  has been authorized for detection and/or diagnosis of SARS-CoV-2 by FDA under an Emergency Use Authorization (EUA).  This EUA will remain in effect (me aning this test can be used) for the duration of  the COVID-19 declaration under Section 564(b)(1) of the Act, 21 U.S.C. section 360-bbb-3(b)(1), unless the authorization is terminated or revoked sooner.  Performed at [facility], [address] [place], [place] [address]     Radiology Reports DG Chest [place] 1 View  Result Date: 02/10/2020 CLINICAL DATA:  Dyspnea, COVID pneumonia EXAM: PORTABLE CHEST 1 VIEW COMPARISON:  None. FINDINGS: Lung volumes are small, but are symmetric. Patchy asymmetric pulmonary infiltrate is seen within the left mid lung zone and lung bases bilaterally in keeping with acute bronchopneumonia in the appropriate clinical setting. No pneumothorax or pleural effusion. Cardiac size within normal limits. Pulmonary vascularity is normal. No acute bone abnormality. IMPRESSION: Patchy asymmetric bilateral pulmonary infiltrates in keeping with acute bronchopneumonia in the appropriate clinical setting. Electronically Signed   By: Helyn Numbers MD   On: 02/10/2020 17:09      Huey Bienenstock M.D on 02/12/2020 at 3:37 PM    [facility] -  Office  [phone]

## 2020-02-12 NOTE — ED Notes (Signed)
TC to Pt's parents with update

## 2020-02-13 LAB — CBC WITH DIFFERENTIAL/PLATELET
Abs Immature Granulocytes: 0.05 10*3/uL (ref 0.00–0.07)
Basophils Absolute: 0 10*3/uL (ref 0.0–0.1)
Basophils Relative: 0 %
Eosinophils Absolute: 0 10*3/uL (ref 0.0–0.5)
Eosinophils Relative: 0 %
HCT: 39.5 % (ref 39.0–52.0)
Hemoglobin: 12.4 g/dL — ABNORMAL LOW (ref 13.0–17.0)
Immature Granulocytes: 1 %
Lymphocytes Relative: 10 %
Lymphs Abs: 0.8 10*3/uL (ref 0.7–4.0)
MCH: 19.8 pg — ABNORMAL LOW (ref 26.0–34.0)
MCHC: 31.4 g/dL (ref 30.0–36.0)
MCV: 63.1 fL — ABNORMAL LOW (ref 80.0–100.0)
Monocytes Absolute: 0.5 10*3/uL (ref 0.1–1.0)
Monocytes Relative: 6 %
Neutro Abs: 7 10*3/uL (ref 1.7–7.7)
Neutrophils Relative %: 83 %
Platelets: 297 10*3/uL (ref 150–400)
RBC: 6.26 MIL/uL — ABNORMAL HIGH (ref 4.22–5.81)
RDW: 15.7 % — ABNORMAL HIGH (ref 11.5–15.5)
WBC: 8.4 10*3/uL (ref 4.0–10.5)
nRBC: 0 % (ref 0.0–0.2)

## 2020-02-13 LAB — HEPATITIS PANEL, ACUTE
HCV Ab: NONREACTIVE
Hep A IgM: NONREACTIVE
Hep B C IgM: NONREACTIVE
Hepatitis B Surface Ag: NONREACTIVE

## 2020-02-13 LAB — COMPREHENSIVE METABOLIC PANEL
ALT: 112 U/L — ABNORMAL HIGH (ref 0–44)
AST: 85 U/L — ABNORMAL HIGH (ref 15–41)
Albumin: 3.3 g/dL — ABNORMAL LOW (ref 3.5–5.0)
Alkaline Phosphatase: 45 U/L (ref 38–126)
Anion gap: 9 (ref 5–15)
BUN: 16 mg/dL (ref 6–20)
CO2: 26 mmol/L (ref 22–32)
Calcium: 8.8 mg/dL — ABNORMAL LOW (ref 8.9–10.3)
Chloride: 105 mmol/L (ref 98–111)
Creatinine, Ser: 1.12 mg/dL (ref 0.61–1.24)
GFR calc Af Amer: >60 mL/min (ref >60)
GFR calc non Af Amer: >60 mL/min (ref >60)
Glucose, Bld: 153 mg/dL — ABNORMAL HIGH (ref 70–99)
Potassium: 4.8 mmol/L (ref 3.5–5.1)
Sodium: 140 mmol/L (ref 135–145)
Total Bilirubin: 0.8 mg/dL (ref 0.3–1.2)
Total Protein: 6 g/dL — ABNORMAL LOW (ref 6.5–8.1)

## 2020-02-13 LAB — C-REACTIVE PROTEIN: CRP: 0.6 mg/dL (ref <1.0)

## 2020-02-13 LAB — D-DIMER, QUANTITATIVE: D-Dimer, Quant: 0.34 ug/mL-FEU (ref 0.00–0.50)

## 2020-02-13 MED ORDER — ENOXAPARIN SODIUM 60 MG/0.6ML ~~LOC~~ SOLN
50.0000 mg | SUBCUTANEOUS | Status: DC
  Administered 2020-02-14: 50 mg via SUBCUTANEOUS
  Filled 2020-02-13: qty 0.6

## 2020-02-13 NOTE — Progress Notes (Signed)
BMI ~35. Ok to increase Lovenox to 0.5mg /kg/day per Dr. Randol Kern.   Ulyses Southward, PharmD, BCIDP, AAHIVP, CPP Infectious Disease Pharmacist 02/13/2020 11:06 AM

## 2020-02-13 NOTE — Progress Notes (Signed)
PROGRESS NOTE                                                                                                                                                                                                             Patient Demographics:    Jeremy Fitzpatrick, is a 24 y.o. male, DOB - Nov 11, 1995, FAO:[id number]  Admit date - 02/10/2020   Admitting Physician Rometta EmeryMohammad L Garba, MD  Outpatient Primary MD for the patient is Cox, Fritzi MandesKirsten, MD  LOS - 2   Chief Complaint  Patient presents with  . Covid Positive       Brief Narrative    HPI: Jeremy Fitzpatrick is a 24 y.o. male with medical history significant of no significant past medical history who was seen yesterday at [facility] [place] with fever chills generalized debility and nausea vomiting.  Patient diagnosed with COVID-19 pneumonia with new oxygen requirement.  He was accepted for admission and [facility].  He is still symptomatic having nausea.  Also diarrhea now.  He still requiring about 2 L of oxygen.  Patient otherwise has some cough which is nonproductive..  ED Course: Temperature 98.2 blood pressure 106/50 pulse 106 respiratory rate of 26 oxygen sat 89% on room air.  White count 6.0 hemoglobin 12.2 platelets 245 chemistry mostly within normal.  Chest x-ray showed bilateral infiltrates significant for COVID-19 pneumonia.  Elevated inflammatory markers as well.  Patient being admitted for further evaluation and treatment.   Subjective:    Jeremy Fitzpatrick today complaining of cough, reports dyspnea has improved, no nausea or vomiting .    Assessment  & Plan :    Principal Problem:   Acute respiratory failure due to COVID-19 [facility](HCC)   acute respiratory failure secondary to COVID-19 pneumonia: -Patient was requiring 2 L nasal cannula yesterday, saturation on room air were 86 to 87% on room air, this morning I have stopped his oxygen where he maintained 94 to 95% on room  air, discussed with staff, will try to ambulate him today in the hallway to see if he has any oxygen requirement, he was encouraged again today to use incentive spirometry and flutter valve, and to stay out of bed to chair and to prone . -Continue with IV steroids -Continue with IV remdesivir -No indication for antibiotics, will discontinue azithromycin -continue to  trend inflammatory markers  Transaminitis -This is due to COVID-19 infection, trending up, will monitor closely, will go ahead and check hepatitis panel.   COVID-19 Labs  Recent Labs    02/10/20 1538 02/10/20 1538 02/11/20 1920 02/12/20 0400 02/13/20 0259  DDIMER 0.51*   < > 0.45 0.39 0.34  FERRITIN 475*  --  535* 512*  --   LDH 241*  --  269*  --   --   CRP 3.0*  --  1.2*  --  0.6   < > = values in this interval not displayed.    Lab Results  Component Value Date   SARSCOV2NAA POSITIVE (A) 02/10/2020     Code Status : Full  Family Communication  : father updated daily.  Disposition Plan  :  Status is: Inpatient  Remains inpatient appropriate because:IV treatments appropriate due to intensity of illness or inability to take PO   Dispo: The patient is from: Home              Anticipated d/c is to: Home              Anticipated d/c date is: 1 day              Patient currently is medically stable to d/c. will continue to monitor LFTs.        Consults  :  none  Procedures  : none  DVT Prophylaxis  :  [place] lovenox  Lab Results  Component Value Date   PLT 297 02/13/2020    Antibiotics  :    Anti-infectives (From admission, onward)   Start     Dose/Rate Route Frequency Ordered Stop   02/12/20 1000  remdesivir 100 mg in sodium chloride 0.9 % 100 mL IVPB  Status:  Discontinued       "Followed by" Linked Group Details   100 mg 200 mL/hr over 30 Minutes Intravenous Daily 02/11/20 1904 02/11/20 1923   02/11/20 1915  azithromycin (ZITHROMAX) 500 mg in sodium chloride 0.9 % 250 mL IVPB  Status:   Discontinued        500 mg 250 mL/hr over 60 Minutes Intravenous Every 24 hours 02/11/20 1904 02/12/20 1542   02/11/20 1904  remdesivir 200 mg in sodium chloride 0.9% 250 mL IVPB  Status:  Discontinued       "Followed by" Linked Group Details   200 mg 580 mL/hr over 30 Minutes Intravenous Once 02/11/20 1904 02/11/20 1923   02/11/20 1000  remdesivir 100 mg in sodium chloride 0.9 % 100 mL IVPB     Discontinue    "Followed by" Linked Group Details   100 mg 200 mL/hr over 30 Minutes Intravenous Daily 02/10/20 1642 02/15/20 0959   02/10/20 1730  remdesivir 100 mg in sodium chloride 0.9 % 100 mL IVPB       "Followed by" Linked Group Details   100 mg 200 mL/hr over 30 Minutes Intravenous  Once 02/10/20 1642 02/10/20 1813   02/10/20 1700  remdesivir 100 mg in sodium chloride 0.9 % 100 mL IVPB       "Followed by" Linked Group Details   100 mg 200 mL/hr over 30 Minutes Intravenous  Once 02/10/20 1642 02/10/20 1731        Objective:   Vitals:   02/12/20 2037 02/13/20 0425 02/13/20 0744 02/13/20 0800  BP: 135/67 125/65 136/71   Pulse: 89 93    Resp: 18 19    Temp: 98.5 F (36.9 C) 98.6 F (  37 C) 98.3 F (36.8 C)   TempSrc: Oral Oral Oral   SpO2: 95% 95%  92%  Weight:  100.1 kg    Height:  5\' 7"  (1.702 m)      Wt Readings from Last 3 Encounters:  02/13/20 100.1 kg     Intake/Output Summary (Last 24 hours) at 02/13/2020 1116 Last data filed at 02/12/2020 1450 Gross per 24 hour  Intake --  Output 1 ml  Net -1 ml     Physical Exam  Awake Alert, Oriented X 3, No new F.N deficits, Normal affect Symmetrical Chest wall movement, Good air movement bilaterally, CTAB RRR,No Gallops,Rubs or new Murmurs, No Parasternal Heave +ve B.Sounds, Abd Soft, No tenderness, No rebound - guarding or rigidity. No Cyanosis, Clubbing or edema, No new Rash or bruise       Data Review:    CBC Recent Labs  Lab 02/10/20 1539 02/11/20 1920 02/12/20 0400 02/13/20 0259  WBC 4.6 4.3 6.0 8.4   HGB 13.0 13.1 12.2* 12.4*  HCT 42.5 42.4 40.0 39.5  PLT 186 278 245 297  MCV 63.4* 63.1* 63.4* 63.1*  MCH 19.4* 19.5* 19.3* 19.8*  MCHC 30.6 30.9 30.5 31.4  RDW 16.4* 16.9* 15.7* 15.7*  LYMPHSABS 0.7 0.5* 0.8 0.8  MONOABS 0.3 0.3 0.4 0.5  EOSABS 0.0 0.0 0.0 0.0  BASOSABS 0.0 0.0 0.0 0.0    Chemistries  Recent Labs  Lab 02/10/20 1539 02/11/20 1920 02/12/20 0400 02/13/20 0259  NA 138 140 140 140  K 4.0 4.0 4.5 4.8  CL 101 103 106 105  CO2 25 26 25 26   GLUCOSE 123* 154* 154* 153*  BUN 16 17 17 16   CREATININE 1.51* 1.07 1.09 1.12  CALCIUM 9.0 9.1 8.7* 8.8*  AST 55* 82* 64* 85*  ALT 44 72* 63* 112*  ALKPHOS 52 45 39 45  BILITOT 0.8 0.8 0.6 0.8   ------------------------------------------------------------------------------------------------------------------ Recent Labs    02/10/20 1538  TRIG 113    No results found for: HGBA1C ------------------------------------------------------------------------------------------------------------------ No results for input(s): TSH, T4TOTAL, T3FREE, THYROIDAB in the last 72 hours.  Invalid input(s): FREET3 ------------------------------------------------------------------------------------------------------------------ Recent Labs    02/11/20 1920 02/12/20 0400  FERRITIN 535* 512*    Coagulation profile No results for input(s): INR, PROTIME in the last 168 hours.  Recent Labs    02/12/20 0400 02/13/20 0259  DDIMER 0.39 0.34    Cardiac Enzymes No results for input(s): CKMB, TROPONINI, MYOGLOBIN in the last 168 hours.  Invalid input(s): CK ------------------------------------------------------------------------------------------------------------------ No results found for: BNP  Inpatient Medications  Scheduled Meds: . vitamin C  500 mg Oral Daily  . dexamethasone (DECADRON) injection  6 mg Intravenous Q24H  . [START ON 02/14/2020] enoxaparin (LOVENOX) injection  50 mg Subcutaneous Q24H  .  Ipratropium-Albuterol  1 puff Inhalation Q6H  . pantoprazole  40 mg Oral Daily  . zinc sulfate  220 mg Oral Daily   Continuous Infusions: . remdesivir 100 mg in NS 100 mL 100 mg (02/13/20 0931)   PRN Meds:.acetaminophen, chlorpheniramine-HYDROcodone, guaiFENesin-dextromethorphan, ondansetron **OR** ondansetron (ZOFRAN) IV  Micro Results Recent Results (from the past 240 hour(s))  Blood culture (routine x 2)     Status: None (Preliminary result)   Collection Time: 02/10/20  3:30 PM   Specimen: BLOOD  Result Value Ref Range Status   Specimen Description   Final    BLOOD LEFT ANTECUBITAL Performed at [facility], [address] [place], [facility] [address]    Special Requests   Final  BOTTLES DRAWN AEROBIC AND ANAEROBIC Blood Culture adequate volume Performed at [facility], [address] [place], [place] [address]    Culture   Final    NO GROWTH 3 DAYS Performed at [facility], [address], [place], [place] [address]    Report Status PENDING  Incomplete  Blood culture (routine x 2)     Status: None (Preliminary result)   Collection Time: 02/10/20  3:42 PM   Specimen: BLOOD LEFT HAND  Result Value Ref Range Status   Specimen Description   Final    BLOOD LEFT HAND Performed at [facility], [address] [place], [place] [address]    Special Requests   Final    BOTTLES DRAWN AEROBIC AND ANAEROBIC Blood Culture adequate volume Performed at [facility], [address] [place], [place] [address]    Culture   Final    NO GROWTH 3 DAYS Performed at [facility], [address], [place], [place] [address]    Report Status PENDING  Incomplete  SARS Coronavirus 2 by RT PCR (hospital order, performed in [facility] hospital lab) Nasopharyngeal Peripheral     Status: Abnormal   Collection Time: 02/10/20  3:42 PM   Specimen: Peripheral; Nasopharyngeal  Result Value Ref Range Status   SARS Coronavirus 2 POSITIVE (A)  NEGATIVE Final    Comment: RESULT CALLED TO, READ BACK BY AND VERIFIED WITH: JAMIE BAILEY RN AT 1700 ON 02/10/2020 BY I.[email or web address] (NOTE) SARS-CoV-2 target nucleic acids are DETECTED  SARS-CoV-2 RNA is generally detectable in upper respiratory specimens  during the acute phase of infection.  Positive results are indicative  of the presence of the identified virus, but do not rule out bacterial infection or co-infection with other pathogens not detected by the test.  Clinical correlation with patient history and  other diagnostic information is necessary to determine patient infection status.  The expected result is negative.  Fact Sheet for Patients:   BoilerBrush.com.cy   Fact Sheet for Healthcare Providers:   https://pope.com/    This test is not yet approved or cleared by the [place] FDA and  has been authorized for detection and/or diagnosis of SARS-CoV-2 by FDA under an Emergency Use Authorization (EUA).  This EUA will remain in effect (me aning this test can be used) for the duration of  the COVID-19 declaration under Section 564(b)(1) of the Act, 21 U.S.C. section 360-bbb-3(b)(1), unless the authorization is terminated or revoked sooner.  Performed at [facility], [address], [place], [place] [address]     Radiology Reports DG Chest [place] 1 View  Result Date: 02/10/2020 CLINICAL DATA:  Dyspnea, COVID pneumonia EXAM: PORTABLE CHEST 1 VIEW COMPARISON:  None. FINDINGS: Lung volumes are small, but are symmetric. Patchy asymmetric pulmonary infiltrate is seen within the left mid lung zone and lung bases bilaterally in keeping with acute bronchopneumonia in the appropriate clinical setting. No pneumothorax or pleural effusion. Cardiac size within normal limits. Pulmonary vascularity is normal. No acute bone abnormality. IMPRESSION: Patchy asymmetric bilateral pulmonary infiltrates in keeping with acute bronchopneumonia  in the appropriate clinical setting. Electronically Signed   By: Helyn Numbers MD   On: 02/10/2020 17:09      Huey Bienenstock M.D on 02/13/2020 at 11:16 AM    [facility] -  Office  [phone]

## 2020-02-14 LAB — CBC WITH DIFFERENTIAL/PLATELET
Abs Immature Granulocytes: 0.17 10*3/uL — ABNORMAL HIGH (ref 0.00–0.07)
Basophils Absolute: 0 10*3/uL (ref 0.0–0.1)
Basophils Relative: 0 %
Eosinophils Absolute: 0 10*3/uL (ref 0.0–0.5)
Eosinophils Relative: 0 %
HCT: 40.7 % (ref 39.0–52.0)
Hemoglobin: 12.5 g/dL — ABNORMAL LOW (ref 13.0–17.0)
Immature Granulocytes: 2 %
Lymphocytes Relative: 17 %
Lymphs Abs: 1.5 10*3/uL (ref 0.7–4.0)
MCH: 19.1 pg — ABNORMAL LOW (ref 26.0–34.0)
MCHC: 30.7 g/dL (ref 30.0–36.0)
MCV: 62.3 fL — ABNORMAL LOW (ref 80.0–100.0)
Monocytes Absolute: 0.8 10*3/uL (ref 0.1–1.0)
Monocytes Relative: 9 %
Neutro Abs: 6.4 10*3/uL (ref 1.7–7.7)
Neutrophils Relative %: 72 %
Platelets: 325 10*3/uL (ref 150–400)
RBC: 6.53 MIL/uL — ABNORMAL HIGH (ref 4.22–5.81)
RDW: 16.4 % — ABNORMAL HIGH (ref 11.5–15.5)
WBC: 8.9 10*3/uL (ref 4.0–10.5)
nRBC: 0.2 % (ref 0.0–0.2)

## 2020-02-14 LAB — COMPREHENSIVE METABOLIC PANEL
ALT: 100 U/L — ABNORMAL HIGH (ref 0–44)
AST: 54 U/L — ABNORMAL HIGH (ref 15–41)
Albumin: 3.2 g/dL — ABNORMAL LOW (ref 3.5–5.0)
Alkaline Phosphatase: 42 U/L (ref 38–126)
Anion gap: 10 (ref 5–15)
BUN: 18 mg/dL (ref 6–20)
CO2: 26 mmol/L (ref 22–32)
Calcium: 8.9 mg/dL (ref 8.9–10.3)
Chloride: 106 mmol/L (ref 98–111)
Creatinine, Ser: 1.12 mg/dL (ref 0.61–1.24)
GFR calc Af Amer: >60 mL/min (ref >60)
GFR calc non Af Amer: >60 mL/min (ref >60)
Glucose, Bld: 106 mg/dL — ABNORMAL HIGH (ref 70–99)
Potassium: 3.9 mmol/L (ref 3.5–5.1)
Sodium: 142 mmol/L (ref 135–145)
Total Bilirubin: 1.2 mg/dL (ref 0.3–1.2)
Total Protein: 6 g/dL — ABNORMAL LOW (ref 6.5–8.1)

## 2020-02-14 LAB — C-REACTIVE PROTEIN: CRP: <0.5 mg/dL (ref <1.0)

## 2020-02-14 LAB — D-DIMER, QUANTITATIVE: D-Dimer, Quant: 0.39 ug/mL-FEU (ref 0.00–0.50)

## 2020-02-14 MED ORDER — CVS VITAMIN C 1000 MG PO TABS: 500.0000 mg | ORAL_TABLET | Freq: Every day | ORAL | Status: AC

## 2020-02-14 MED ORDER — PANTOPRAZOLE SODIUM 40 MG PO TBEC: 40.0000 mg | DELAYED_RELEASE_TABLET | Freq: Every day | ORAL | 0 refills | Status: AC

## 2020-02-14 MED ORDER — DEXAMETHASONE 6 MG PO TABS: 6.0000 mg | ORAL_TABLET | Freq: Every day | ORAL | 0 refills | Status: AC

## 2020-02-14 NOTE — Progress Notes (Signed)
Patient was discharged home by MD order; discharged instructions  review and give to patient with care notes and prescription; IV DIC; skin intact; patient will be escorted to the car by nurse tech via wheelchair.  

## 2020-02-14 NOTE — Discharge Summary (Signed)
Jeremy Fitzpatrick, is a 24 y.o. male  DOB 1996-02-27  MRN [id number].  Admission date:  02/10/2020  Admitting Physician  Rometta Emery, MD  Discharge Date:  02/14/2020   Primary MD  Blane Ohara, MD  Recommendations for primary care physician for things to follow:  -Please check CBC, CMP to ensure liver enzymes back to normal   Admission Diagnosis  Acute respiratory failure with hypoxia (HCC) [J96.01] Acute respiratory failure due to COVID-19 (HCC) [U07.1, J96.00] COVID-19 [U07.1]   Discharge Diagnosis  Acute respiratory failure with hypoxia (HCC) [J96.01] Acute respiratory failure due to COVID-19 (HCC) [U07.1, J96.00] COVID-19 [U07.1]    Principal Problem:   Acute respiratory failure due to COVID-19 [facility])      History reviewed. No pertinent past medical history.  History reviewed. No pertinent surgical history.     History of present illness and  Hospital Course:     Kindly see H&P for history of present illness and admission details, please review complete Labs, Consult reports and Test reports for all details in brief  HPI  from the history and physical done on the day of admission 02/10/2020  HPI: Jeremy Fitzpatrick is a 24 y.o. male with medical history significant of no significant past medical history who was seen yesterday at [facility] with fever chills generalized debility and nausea vomiting.  Patient diagnosed with COVID-19 pneumonia with new oxygen requirement.  He was accepted for admission and transferred to [facility] ER for hold-up.  He is still symptomatic having nausea.  Also diarrhea now.  He still requiring about 2 L of oxygen.  Patient otherwise has some cough which is nonproductive..  ED Course: Temperature 98.2 blood pressure 106/50 pulse 106 respiratory rate of 26 oxygen sat 89% on room air.  White count 6.0 hemoglobin 12.2 platelets 245 chemistry mostly within  normal.  Chest x-ray showed bilateral infiltrates significant for COVID-19 pneumonia.  Elevated inflammatory markers as well.  Patient being admitted for further evaluation and treatment.  Hospital Course     acute respiratory failure secondary to COVID-19 pneumonia: -Patient was requiring 2 L nasal cannula yesterday, saturation on room air were 86 to 87% on room air initially, this has resolved, his been on room air over last 24 hours, ambulated in the hallway today with saturating 96% on room air, he was encouraged to take incentive spirometry and flutter valve with him and keep using it, he was treated with IV steroids, to be discharged on another 5 days of oral Decadron for total of 10 days treatment, he was treated with remdesivir as well.  Procalcitonin within normal limit, so no indication for antibiotics.  Transaminitis -This is due to COVID-19 infection, currently trending down, negative hepatitis panels, monitor as an outpatient to ensure it resolves.  Discharge Condition:  stable   Follow UP   Follow-up Information    [facility], Kirsten, MD Follow up in 2 week(s).   Specialties: Family Medicine, Interventional Cardiology, Radiology, Anesthesiology Contact information: [address] [facility] [address]  [place] [address] [phone]                 Discharge Instructions  and  Discharge Medications    Discharge Instructions    Discharge instructions   Complete by: As directed    Follow with Primary MD Blane Ohara, MD in 14 days   Get CBC, CMP,  checked  by Primary MD next visit.    Activity: As tolerated    Disposition Home    Diet: Regular diet.  On your next visit with your primary care physician please Get Medicines reviewed and adjusted.   Please request your Prim.MD to go over all Hospital Tests and Procedure/Radiological results at the follow up, please get all Hospital records sent to your Prim MD by signing hospital release before you go  home.   If you experience worsening of your admission symptoms, develop shortness of breath, life threatening emergency, suicidal or homicidal thoughts you must seek medical attention immediately by calling 911 or calling your MD immediately  if symptoms less severe.  You Must read complete instructions/literature along with all the possible adverse reactions/side effects for all the Medicines you take and that have been prescribed to you. Take any new Medicines after you have completely understood and accpet all the possible adverse reactions/side effects.   Do not drive, operating heavy machinery, perform activities at heights, swimming or participation in water activities or provide baby sitting services if your were admitted for syncope or siezures until you have seen by Primary MD or a Neurologist and advised to do so again.  Do not drive when taking Pain medications.    Do not take more than prescribed Pain, Sleep and Anxiety Medications  Special Instructions: If you have smoked or chewed Tobacco  in the last 2 yrs please stop smoking, stop any regular Alcohol  and or any Recreational drug use.  Wear Seat belts while driving.   Please note  You were cared for by a hospitalist during your hospital stay. If you have any questions about your discharge medications or the care you received while you were in the hospital after you are discharged, you can call the unit and asked to speak with the hospitalist on call if the hospitalist that took care of you is not available. Once you are discharged, your primary care physician will handle any further medical issues. Please note that NO REFILLS for any discharge medications will be authorized once you are discharged, as it is imperative that you return to your primary care physician (or establish a relationship with a primary care physician if you do not have one) for your aftercare needs so that they can reassess your need for medications and  monitor your lab values.     Allergies as of 02/14/2020   No Known Allergies     Medication List    STOP taking these medications   azithromycin 250 MG tablet Commonly known as: ZITHROMAX   celecoxib 200 MG capsule Commonly known as: CELEBREX   predniSONE 10 MG tablet Commonly known as: DELTASONE     TAKE these medications   [facility] Vitamin C 1000 MG tablet Generic drug: ascorbic acid Take 0.5 tablets (500 mg total) by mouth daily. What changed: how much to take   D3-50 1.25 MG (50000 UT) capsule Generic drug: Cholecalciferol Take 50,000 Units by mouth daily.   dexamethasone 6 MG tablet Commonly known as: DECADRON Take 1 tablet (6 mg total) by mouth daily. Start taking on: February 15, 2020   pantoprazole 40 MG tablet Commonly known as: PROTONIX Take 1 tablet (40 mg total) by mouth daily. Start taking on: February 15, 2020   zinc gluconate 50 MG tablet Take 50 mg by mouth daily.         Diet and Activity recommendation: See Discharge Instructions above   Consults obtained -  none   Major procedures and Radiology Reports - PLEASE review detailed and final reports for all details, in brief -      DG Chest Port 1 View  Result Date: 02/10/2020 CLINICAL DATA:  Dyspnea, COVID pneumonia EXAM: PORTABLE CHEST 1 VIEW COMPARISON:  None. FINDINGS: Lung volumes are small, but are symmetric. Patchy asymmetric pulmonary infiltrate is seen within the left mid lung zone and lung bases bilaterally in keeping with acute bronchopneumonia in the appropriate clinical setting. No pneumothorax or pleural effusion. Cardiac size within normal limits. Pulmonary vascularity is normal. No acute bone abnormality. IMPRESSION: Patchy asymmetric bilateral pulmonary infiltrates in keeping with acute bronchopneumonia in the appropriate clinical setting. Electronically Signed   By: Helyn NumbersAshesh  Parikh MD   On: 02/10/2020 17:09    Micro Results    Recent Results (from the past 240 hour(s))  Blood  culture (routine x 2)     Status: None (Preliminary result)   Collection Time: 02/10/20  3:30 PM   Specimen: BLOOD  Result Value Ref Range Status   Specimen Description   Final    BLOOD LEFT ANTECUBITAL Performed at [facility], [address] [place], [place] [address]    Special Requests   Final    BOTTLES DRAWN AEROBIC AND ANAEROBIC Blood Culture adequate volume Performed at [facility] [place], [address] [place], [place] [address]    Culture   Final    NO GROWTH 4 DAYS Performed at [facility], [address] [place], [place] [address]    Report Status PENDING  Incomplete  Blood culture (routine x 2)     Status: None (Preliminary result)   Collection Time: 02/10/20  3:42 PM   Specimen: BLOOD LEFT HAND  Result Value Ref Range Status   Specimen Description   Final    BLOOD LEFT HAND Performed at [facility] [place], [address] [place], [place] [address]    Special Requests   Final    BOTTLES DRAWN AEROBIC AND ANAEROBIC Blood Culture adequate volume Performed at [facility] [place], [address] [place], [place] [address]    Culture   Final    NO GROWTH 4 DAYS Performed at [facility], [address] [place], [place] [address]    Report Status PENDING  Incomplete  SARS Coronavirus 2 by RT PCR (hospital order, performed in [facility] hospital lab) Nasopharyngeal Peripheral     Status: Abnormal   Collection Time: 02/10/20  3:42 PM   Specimen: Peripheral; Nasopharyngeal  Result Value Ref Range Status   SARS Coronavirus 2 POSITIVE (A) NEGATIVE Final    Comment: RESULT CALLED TO, READ BACK BY AND VERIFIED WITH: JAMIE BAILEY RN AT 1700 ON 02/10/2020 BY I.[email or web address] (NOTE) SARS-CoV-2 target nucleic acids are DETECTED  SARS-CoV-2 RNA is generally detectable in upper respiratory specimens  during the acute phase of infection.  Positive results are indicative  of the presence of the identified virus, but do not rule out bacterial  infection or co-infection with other pathogens not detected by the test.  Clinical correlation with patient history and  other diagnostic information is necessary to determine patient infection status.  The expected result is negative.  Fact Sheet for Patients:   BoilerBrush.com.cy   Fact Sheet for Healthcare Providers:   https://pope.com/    This test is not yet approved or cleared by the [place] FDA and  has been authorized for detection and/or diagnosis of SARS-CoV-2 by FDA under an Emergency Use Authorization (EUA).  This EUA will remain in effect (me aning this test can be used) for the duration of  the COVID-19 declaration under Section 564(b)(1) of the Act, 21 U.S.C. section 360-bbb-3(b)(1), unless the authorization is terminated or revoked sooner.  Performed at [facility], [address] [place], [place] [address]        Today   Subjective:   Jontay Maston today has no headache,no chest abdominal pain,no new weakness tingling or numbness, feels much better wants to go home today.  Objective:   Blood pressure (!) 146/91, pulse 76, temperature 98.2 F (36.8 C), temperature source Oral, resp. rate 17, height 5\' 7"  (1.702 m), weight 100.1 kg, SpO2 92 %.   Intake/Output Summary (Last 24 hours) at 02/14/2020 1056 Last data filed at 02/14/2020 0907 Gross per 24 hour  Intake 880 ml  Output --  Net 880 ml    Exam Awake Alert, Oriented x 3, No new F.N deficits, Normal affect Symmetrical Chest wall movement, Good air movement bilaterally, CTAB RRR,No Gallops,Rubs or new Murmurs, No Parasternal Heave +ve B.Sounds, Abd Soft, Non tender, No organomegaly appriciated, No rebound -guarding or rigidity. No Cyanosis, Clubbing or edema, No new Rash or bruise  Data Review   CBC w Diff:  Lab Results  Component Value Date   WBC 8.9 02/14/2020   HGB 12.5 (L) 02/14/2020   HCT 40.7 02/14/2020   PLT 325  02/14/2020   LYMPHOPCT 17 02/14/2020   MONOPCT 9 02/14/2020   EOSPCT 0 02/14/2020   BASOPCT 0 02/14/2020    CMP:  Lab Results  Component Value Date   NA 142 02/14/2020   K 3.9 02/14/2020   CL 106 02/14/2020   CO2 26 02/14/2020   BUN 18 02/14/2020   CREATININE 1.12 02/14/2020   PROT 6.0 (L) 02/14/2020   ALBUMIN 3.2 (L) 02/14/2020   BILITOT 1.2 02/14/2020   ALKPHOS 42 02/14/2020   AST 54 (H) 02/14/2020   ALT 100 (H) 02/14/2020  .   Total Time in preparing paper work, data evaluation and todays exam - 35 minutes  02/16/2020 M.D on 02/14/2020 at 10:56 AM  [facility]   Office  [phone]

## 2020-02-14 NOTE — Discharge Instructions (Signed)
Person Under Monitoring Name: Jeremy Fitzpatrick  Location: [address] [place] [address]   Infection Prevention Recommendations for Individuals Confirmed to have, or Being Evaluated for, 2019 Novel Coronavirus (COVID-19) Infection Who Receive Care at Home  Individuals who are confirmed to have, or are being evaluated for, COVID-19 should follow the prevention steps below until a healthcare provider or local or state health department says they can return to normal activities.  Stay home except to get medical care You should restrict activities outside your home, except for getting medical care. Do not go to work, school, or public areas, and do not use public transportation or taxis.  Call ahead before visiting your doctor Before your medical appointment, call the healthcare provider and tell them that you have, or are being evaluated for, COVID-19 infection. This will help the healthcare provider's office take steps to keep other people from getting infected. Ask your healthcare provider to call the local or state health department.  Monitor your symptoms Seek prompt medical attention if your illness is worsening (e.g., difficulty breathing). Before going to your medical appointment, call the healthcare provider and tell them that you have, or are being evaluated for, COVID-19 infection. Ask your healthcare provider to call the local or state health department.  Wear a facemask You should wear a facemask that covers your nose and mouth when you are in the same room with other people and when you visit a healthcare provider. People who live with or visit you should also wear a facemask while they are in the same room with you.  Separate yourself from other people in your home As much as possible, you should stay in a different room from other people in your home. Also, you should use a separate bathroom, if available.  Avoid sharing household items You should not  share dishes, drinking glasses, cups, eating utensils, towels, bedding, or other items with other people in your home. After using these items, you should wash them thoroughly with soap and water.  Cover your coughs and sneezes Cover your mouth and nose with a tissue when you cough or sneeze, or you can cough or sneeze into your sleeve. Throw used tissues in a lined trash can, and immediately wash your hands with soap and water for at least 20 seconds or use an alcohol-based hand rub.  Wash your [employer] your hands often and thoroughly with soap and water for at least 20 seconds. You can use an alcohol-based hand sanitizer if soap and water are not available and if your hands are not visibly dirty. Avoid touching your eyes, nose, and mouth with unwashed hands.   Prevention Steps for Caregivers and Household Members of Individuals Confirmed to have, or Being Evaluated for, COVID-19 Infection Being Cared for in the Home  If you live with, or provide care at home for, a person confirmed to have, or being evaluated for, COVID-19 infection please follow these guidelines to prevent infection:  Follow healthcare provider's instructions Make sure that you understand and can help the patient follow any healthcare provider instructions for all care.  Provide for the patient's basic needs You should help the patient with basic needs in the home and provide support for getting groceries, prescriptions, and other personal needs.  Monitor the patient's symptoms If they are getting sicker, call his or her medical provider and tell them that the patient has, or is being evaluated for, COVID-19 infection. This will help the healthcare provider's office take  steps to keep other people from getting infected. Ask the healthcare provider to call the local or state health department.  Limit the number of people who have contact with the patient  If possible, have only one caregiver for the  patient.  Other household members should stay in another home or place of residence. If this is not possible, they should stay  in another room, or be separated from the patient as much as possible. Use a separate bathroom, if available.  Restrict visitors who do not have an essential need to be in the home.  Keep older adults, very young children, and other sick people away from the patient Keep older adults, very young children, and those who have compromised immune systems or chronic health conditions away from the patient. This includes people with chronic heart, lung, or kidney conditions, diabetes, and cancer.  Ensure good ventilation Make sure that shared spaces in the home have good air flow, such as from an air conditioner or an opened window, weather permitting.  Wash your hands often  Wash your hands often and thoroughly with soap and water for at least 20 seconds. You can use an alcohol based hand sanitizer if soap and water are not available and if your hands are not visibly dirty.  Avoid touching your eyes, nose, and mouth with unwashed hands.  Use disposable paper towels to dry your hands. If not available, use dedicated cloth towels and replace them when they become wet.  Wear a facemask and gloves  Wear a disposable facemask at all times in the room and gloves when you touch or have contact with the patient's blood, body fluids, and/or secretions or excretions, such as sweat, saliva, sputum, nasal mucus, vomit, urine, or feces.  Ensure the mask fits over your nose and mouth tightly, and do not touch it during use.  Throw out disposable facemasks and gloves after using them. Do not reuse.  Wash your hands immediately after removing your facemask and gloves.  If your personal clothing becomes contaminated, carefully remove clothing and launder. Wash your hands after handling contaminated clothing.  Place all used disposable facemasks, gloves, and other waste in a lined  container before disposing them with other household waste.  Remove gloves and wash your hands immediately after handling these items.  Do not share dishes, glasses, or other household items with the patient  Avoid sharing household items. You should not share dishes, drinking glasses, cups, eating utensils, towels, bedding, or other items with a patient who is confirmed to have, or being evaluated for, COVID-19 infection.  After the person uses these items, you should wash them thoroughly with soap and water.  Wash laundry thoroughly  Immediately remove and wash clothes or bedding that have blood, body fluids, and/or secretions or excretions, such as sweat, saliva, sputum, nasal mucus, vomit, urine, or feces, on them.  Wear gloves when handling laundry from the patient.  Read and follow directions on labels of laundry or clothing items and detergent. In general, wash and dry with the warmest temperatures recommended on the label.  Clean all areas the individual has used often  Clean all touchable surfaces, such as counters, tabletops, doorknobs, bathroom fixtures, toilets, phones, keyboards, tablets, and bedside tables, every day. Also, clean any surfaces that may have blood, body fluids, and/or secretions or excretions on them.  Wear gloves when cleaning surfaces the patient has come in contact with.  Use a diluted bleach solution (e.g., dilute bleach with 1 part bleach  and 10 parts water) or a household disinfectant with a label that says EPA-registered for coronaviruses. To make a bleach solution at home, add 1 tablespoon of bleach to 1 quart (4 cups) of water. For a larger supply, add  cup of bleach to 1 gallon (16 cups) of water.  Read labels of cleaning products and follow recommendations provided on product labels. Labels contain instructions for safe and effective use of the cleaning product including precautions you should take when applying the product, such as wearing gloves or  eye protection and making sure you have good ventilation during use of the product.  Remove gloves and wash hands immediately after cleaning.  Monitor yourself for signs and symptoms of illness Caregivers and household members are considered close contacts, should monitor their health, and will be asked to limit movement outside of the home to the extent possible. Follow the monitoring steps for close contacts listed on the symptom monitoring form.   ? If you have additional questions, contact your local health department or call the epidemiologist on call at [phone] (available 24/7). ? This guidance is subject to change. For the most up-to-date guidance from [facility], please refer to their website: YouBlogs.pl

## 2020-02-14 NOTE — Progress Notes (Signed)
SATURATION QUALIFICATIONS: (This note is used to comply with regulatory documentation for home oxygen)  Patient Saturations on Room Air at Rest = 96%  Patient Saturations on Room Air while Ambulating = 94%  Patient Saturations on 0 Liters of oxygen while Ambulating = 94%  Please briefly explain why patient needs home oxygen: 

## 2020-02-15 LAB — CULTURE, BLOOD (ROUTINE X 2)
Culture: NO GROWTH
Culture: NO GROWTH
Special Requests: ADEQUATE
Special Requests: ADEQUATE

## 2022-04-14 IMAGING — DX DG CHEST 1V PORT
1 series · 1 of 1 positions shown · non-contrast
Comparison: None.

CLINICAL DATA: Dyspnea, COVID pneumonia

EXAM:
PORTABLE CHEST 1 VIEW

[chest ap]
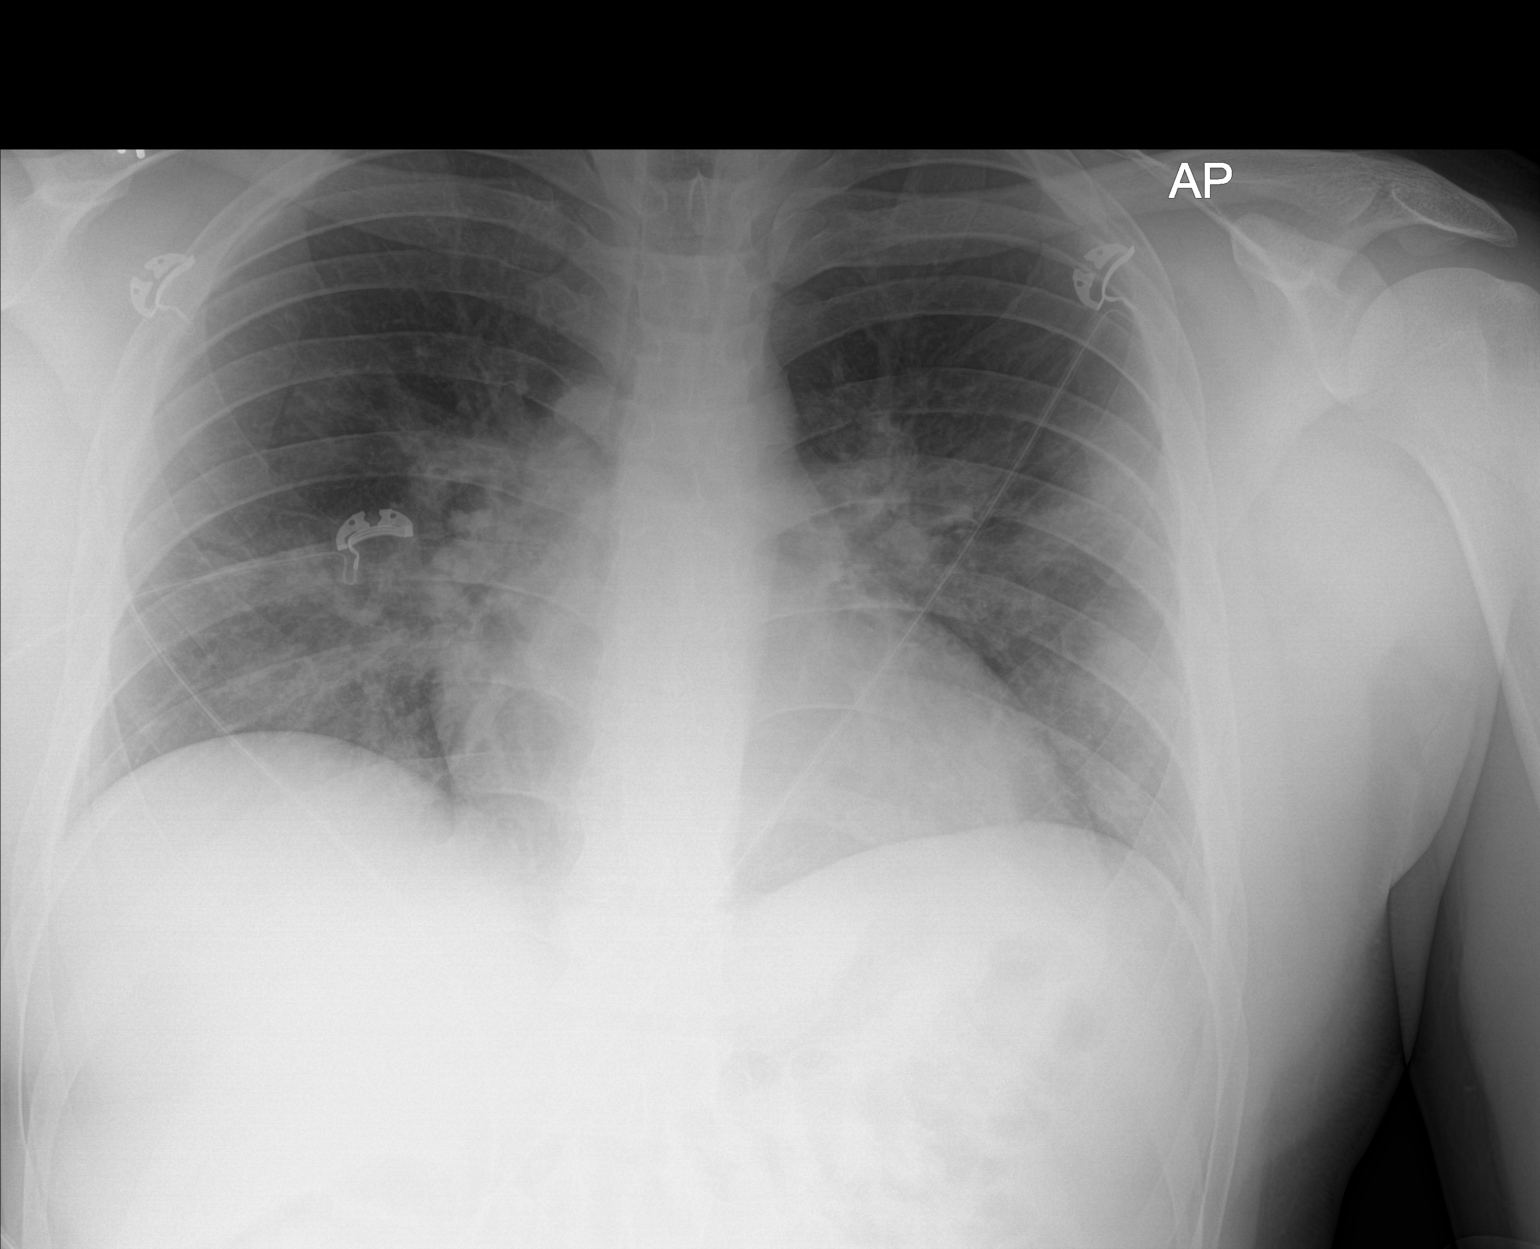

[1 of 1 positions shown; findings below may reference images not displayed]

FINDINGS: Lung volumes are small, but are symmetric. Patchy asymmetric
pulmonary infiltrate is seen within the left mid lung zone and lung
bases bilaterally in keeping with acute bronchopneumonia in the
appropriate clinical setting. No pneumothorax or pleural effusion.
Cardiac size within normal limits. Pulmonary vascularity is normal.
No acute bone abnormality.
IMPRESSION: Patchy asymmetric bilateral pulmonary infiltrates in keeping with
acute bronchopneumonia in the appropriate clinical setting.

## 2023-01-09 DIAGNOSIS — D563 Thalassemia minor: Secondary | ICD-10-CM | POA: Diagnosis not present

## 2023-01-09 DIAGNOSIS — Z0000 Encounter for general adult medical examination without abnormal findings: Secondary | ICD-10-CM | POA: Diagnosis not present

## 2023-01-09 DIAGNOSIS — Z8349 Family history of other endocrine, nutritional and metabolic diseases: Secondary | ICD-10-CM | POA: Diagnosis not present

## 2023-01-09 DIAGNOSIS — Z833 Family history of diabetes mellitus: Secondary | ICD-10-CM | POA: Diagnosis not present

## 2023-01-09 DIAGNOSIS — Z6838 Body mass index (BMI) 38.0-38.9, adult: Secondary | ICD-10-CM | POA: Diagnosis not present

## 2023-01-09 DIAGNOSIS — Z83438 Family history of other disorder of lipoprotein metabolism and other lipidemia: Secondary | ICD-10-CM | POA: Diagnosis not present
